# Patient Record
Sex: Female | Born: 2000 | Marital: Single | Smoking: Never smoker
Health system: Southern US, Community
[De-identification: ages and names within clinical notes are randomized; demographics above are authoritative.]

## PROBLEM LIST (undated history)

## (undated) DIAGNOSIS — R51 Headache: Secondary | ICD-10-CM

## (undated) DIAGNOSIS — F319 Bipolar disorder, unspecified: Secondary | ICD-10-CM

## (undated) DIAGNOSIS — F419 Anxiety disorder, unspecified: Secondary | ICD-10-CM

---
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
## 2021-07-04 ENCOUNTER — Emergency Department
Admission: EM | Admit: 2021-07-04 | Discharge: 2021-07-04 | Disposition: A | Discharge status: Home / Self Care | Payer: Medicaid Other | Attending: Emergency Medicine | Admitting: Emergency Medicine

## 2021-07-04 ENCOUNTER — Other Ambulatory Visit: Payer: Self-pay

## 2021-07-04 ENCOUNTER — Emergency Department: Payer: Medicaid Other

## 2021-07-04 DIAGNOSIS — R052 Subacute cough: Secondary | ICD-10-CM | POA: Diagnosis not present

## 2021-07-04 DIAGNOSIS — R0789 Other chest pain: Secondary | ICD-10-CM | POA: Diagnosis not present

## 2021-07-04 DIAGNOSIS — R059 Cough, unspecified: Secondary | ICD-10-CM | POA: Diagnosis present

## 2021-07-04 MED ORDER — BENZONATATE 100 MG PO CAPS: 100.0000 mg | ORAL_CAPSULE | Freq: Three times a day (TID) | ORAL | 0 refills | Status: AC | PRN

## 2021-07-04 NOTE — ED Triage Notes (Addendum)
Pt to ED via POV from home. Pt reports she has been coughing x3 weeks. Pt states the other day she heard cracking under her right breast. Pt also reports pain with inhalation.   Pt denies N/V/D, CP, fevers.

## 2021-07-04 NOTE — ED Notes (Signed)
Called x1

## 2021-07-04 NOTE — ED Provider Notes (Signed)
[facility] Emergency Department Provider Note   ____________________________________________   Event Date/Time   First MD Initiated Contact with Patient 07/04/21 1919     (approximate)  I have reviewed the triage vital signs and the nursing notes.   HISTORY  Chief Complaint Chest Pain and Shortness of Breath    HPI [name] [name] is a 21 y.o. female reports to the emergency room for complaint of cough for the past 3 weeks.  Patient reports that approximately 2 weeks ago she heard a "pop" underneath her right breast and then again hurting yesterday. She reports that her cough is dry and nonproductive.  She denies any fevers.  She denies any other signs of upper respiratory infection such as runny nose/sneezing/sore throat/ear pain. At this time she denies any shortness of breath/chest pain. She does report that intermittently she will have chest wall pain to the right breast and underarm region. She has not taken anything for the pain or the cough at this time.  Past Medical History:  Diagnosis Date   Anxiety    Bipolar disorder, unspecified (HCC) 02/19/2015   Headache(784.0)     Patient Active Problem List   Diagnosis Date Noted   Insomnia    Bipolar disorder, unspecified (HCC) 02/19/2015   MDD (major depressive disorder), single episode, severe (HCC) 12/30/2012   GAD (generalized anxiety disorder) 12/30/2012   Suicidal ideation 12/29/2012    No past surgical history on file.  Prior to Admission medications   Medication Sig Start Date End Date Taking? Authorizing Provider  benzonatate (TESSALON PERLES) 100 MG capsule Take 1 capsule (100 mg total) by mouth 3 (three) times daily as needed for up to 5 days for cough. 07/04/21 07/09/21 Yes Herschell Dimes, NP  ARIPiprazole (ABILIFY) 10 MG tablet Take 1 tablet (10 mg total) by mouth at bedtime. 02/25/15   Saez-Benito, Phillips Climes, MD  ibuprofen (ADVIL,MOTRIN) 600 MG tablet Take 1 tablet (600 mg  total) by mouth every 6 (six) hours as needed. 07/20/15   Beers, Charmayne Sheer, PA-C  ondansetron (ZOFRAN) 4 MG tablet Take 1 tablet (4 mg total) by mouth daily as needed for nausea or vomiting. 03/29/15   Emily Filbert, MD    Allergies Patient has no known allergies.  Family History  Problem Relation Age of Onset   Schizophrenia Maternal Aunt     Social History Social History   Tobacco Use   Smoking status: Never   Smokeless tobacco: Never  Substance Use Topics   Alcohol use: No   Drug use: No    Review of Systems  Constitutional: No fever/chills Eyes: No visual changes. ENT: No sore throat. Cardiovascular: Denies chest pain. Respiratory: Denies shortness of breath.  Positive for cough Gastrointestinal: No abdominal pain.  No nausea, no vomiting.  No diarrhea.  No constipation. Genitourinary: Negative for dysuria. Musculoskeletal: Positive for intermittent chest wall pain to the right breast and underarm region. Skin: Negative for rash. Neurological: Negative for headaches, focal weakness or numbness.   ____________________________________________   PHYSICAL EXAM:  VITAL SIGNS: ED Triage Vitals [07/04/21 1840]  Enc Vitals Group     BP 129/75     Pulse Rate 86     Resp 18     Temp 98.1 F (36.7 C)     Temp Source Oral     SpO2 98 %     Weight 140 lb (63.5 kg)     Height 5\' 4"  (1.626 m)  Head Circumference      Peak Flow      Pain Score 4     Pain Loc      Pain Edu?      Excl. in GC?     Constitutional: Alert and oriented. Well appearing and in no acute distress. Eyes: Conjunctivae are normal. PERRL. EOMI. Head: Atraumatic. Nose: No congestion/rhinnorhea. Mouth/Throat: Mucous membranes are moist.  Oropharynx non-erythematous. Neck: No stridor.   Cardiovascular: Normal rate, regular rhythm. Grossly normal heart sounds.  Good peripheral circulation. Respiratory: Normal respiratory effort.  No retractions. Lungs CTAB. Gastrointestinal: Soft and  nontender. No distention. No abdominal bruits. No CVA tenderness. Musculoskeletal: No lower extremity tenderness nor edema.  No joint effusions.  Patient has no reproducible pain to the right chest wall/breast region/underarm region.  And denies pain at this time. Neurologic:  Normal speech and language. No gross focal neurologic deficits are appreciated. No gait instability. Skin:  Skin is warm, dry and intact. No rash noted. Psychiatric: Mood and affect are normal. Speech and behavior are normal.  ____________________________________________   LABS (all labs ordered are listed, but only abnormal results are displayed)  Labs Reviewed - No data to display ____________________________________________  EKG  EKG was obtained and read by ED provider. EKG shows normal sinus rhythm at a rate of 72.  There is no ST elevation or depression.  PR and QRS intervals are normal. ____________________________________________  RADIOLOGY  ED MD interpretation: Chest x-ray was reviewed by me and read by radiologist.  Official radiology report(s): DG Chest 2 View  Result Date: 07/04/2021 CLINICAL DATA:  Shortness of breath EXAM: CHEST - 2 VIEW COMPARISON:  07/04/2007 FINDINGS: Cardiac and mediastinal contours are within normal limits. No focal pulmonary opacity. No pleural effusion or pneumothorax. No acute osseous abnormality. IMPRESSION: No acute cardiopulmonary process. Electronically Signed   By: Wiliam KeAlison  Vasan M.D.   On: 07/04/2021 19:23    ____________________________________________   PROCEDURES  Procedure(s) performed: None  Procedures  Critical Care performed: No  ____________________________________________   INITIAL IMPRESSION / ASSESSMENT AND PLAN / ED COURSE     [name] [name] is a 21 y.o. female reports to the emergency room for complaint of cough for the past 3 weeks.  Patient reports that approximately 2 weeks ago she heard a "pop" underneath her right breast and then  again hurting yesterday. She reports that her cough is dry and nonproductive.  She denies any fevers.  She denies any other signs of upper respiratory infection such as runny nose/sneezing/sore throat/ear pain. At this time she denies any shortness of breath/chest pain. She does report that intermittently she will have chest wall pain to the right breast and underarm region. She has not taken anything for the pain or the cough at this time. Patient reports that she is only coughing 2-3 times a day.  But feels that this is concerning as it is affecting her job.  Patient had a EKG that was normal. Patient also had chest x-ray that showed no acute pulmonary process.  Patient's exam was reassuring.  Based on review of EKG/chest x-ray and evaluation, patient will be discharged home in stable condition at this time.  I will prescribe her Tessalon Perles for the cough. She should follow-up with her primary care doctor if symptoms persist or worsen.      ____________________________________________   FINAL CLINICAL IMPRESSION(S) / ED DIAGNOSES  Final diagnoses:  Subacute cough  Chest wall pain     ED Discharge Orders  Ordered    benzonatate (TESSALON PERLES) 100 MG capsule  3 times daily PRN        07/04/21 1938             Note:  This document was prepared using Dragon voice recognition software and may include unintentional dictation errors.     Herschell Dimes, NP 07/04/21 2022    Shaune Pollack, MD 07/07/21 [phone]

## 2021-07-04 NOTE — Discharge Instructions (Addendum)
Seen today in the emergency room for a cough that has persisted for the past 3 weeks.  Your chest x-ray was normal and showed no signs of infection or fractured ribs which you were concerned about.  I will give you a short course of Tessalon Perles for the cough.  You should follow-up with your primary care provider if symptoms persist or worsen.

## 2023-12-01 ENCOUNTER — Encounter: Payer: Self-pay | Admitting: Emergency Medicine

## 2023-12-01 ENCOUNTER — Other Ambulatory Visit: Payer: Self-pay

## 2023-12-01 ENCOUNTER — Emergency Department
Admission: EM | Admit: 2023-12-01 | Discharge: 2023-12-01 | Discharge status: Against Medical Advice | Payer: Self-pay | Source: Ambulatory Visit | Attending: Emergency Medicine | Admitting: Emergency Medicine

## 2023-12-01 DIAGNOSIS — R1032 Left lower quadrant pain: Secondary | ICD-10-CM | POA: Insufficient documentation

## 2023-12-01 DIAGNOSIS — Z5321 Procedure and treatment not carried out due to patient leaving prior to being seen by health care provider: Secondary | ICD-10-CM | POA: Insufficient documentation

## 2023-12-01 LAB — COMPREHENSIVE METABOLIC PANEL WITH GFR
ALT: 19 U/L (ref 0–44)
AST: 18 U/L (ref 15–41)
Albumin: 4.1 g/dL (ref 3.5–5.0)
Alkaline Phosphatase: 50 U/L (ref 38–126)
Anion gap: 7 (ref 5–15)
BUN: 7 mg/dL (ref 6–20)
CO2: 23 mmol/L (ref 22–32)
Calcium: 9 mg/dL (ref 8.9–10.3)
Chloride: 107 mmol/L (ref 98–111)
Creatinine, Ser: 0.57 mg/dL (ref 0.44–1.00)
GFR, Estimated: >60 mL/min (ref >60)
Glucose, Bld: 96 mg/dL (ref 70–99)
Potassium: 4 mmol/L (ref 3.5–5.1)
Sodium: 137 mmol/L (ref 135–145)
Total Bilirubin: 0.6 mg/dL (ref 0.0–1.2)
Total Protein: 7 g/dL (ref 6.5–8.1)

## 2023-12-01 LAB — CBC
HCT: 42.3 % (ref 36.0–46.0)
Hemoglobin: 14.1 g/dL (ref 12.0–15.0)
MCH: 29.4 pg (ref 26.0–34.0)
MCHC: 33.3 g/dL (ref 30.0–36.0)
MCV: 88.1 fL (ref 80.0–100.0)
Platelets: 187 K/uL (ref 150–400)
RBC: 4.8 MIL/uL (ref 3.87–5.11)
RDW: 12 % (ref 11.5–15.5)
WBC: 6.8 K/uL (ref 4.0–10.5)
nRBC: 0 % (ref 0.0–0.2)

## 2023-12-01 LAB — LIPASE, BLOOD: Lipase: 26 U/L (ref 11–51)

## 2023-12-01 NOTE — ED Triage Notes (Signed)
 Sent to ED from [facility] for evaluation.  For C/O LLQ and suprapubic abd cramping.  Patient is requesting an ultrasound to be evaluated for endometriosis.'    AAOx3.  Skin warm and dry. NAD

## 2023-12-01 NOTE — ED Triage Notes (Signed)
 Patient to ED via POV form [facility] for intermittent abd cramping. Ongoing x1 year. States have some vomiting or diarrhea yesterday but none today. NAD noted. States irregular periods- last one in March.

## 2024-01-21 ENCOUNTER — Encounter: Payer: Self-pay | Admitting: Emergency Medicine

## 2024-01-21 ENCOUNTER — Other Ambulatory Visit: Payer: Self-pay

## 2024-01-21 ENCOUNTER — Emergency Department
Admission: EM | Admit: 2024-01-21 | Discharge: 2024-01-21 | Disposition: A | Discharge status: Home / Self Care | Payer: Self-pay | Attending: Emergency Medicine | Admitting: Emergency Medicine

## 2024-01-21 DIAGNOSIS — R040 Epistaxis: Secondary | ICD-10-CM | POA: Insufficient documentation

## 2024-01-21 DIAGNOSIS — J101 Influenza due to other identified influenza virus with other respiratory manifestations: Secondary | ICD-10-CM | POA: Insufficient documentation

## 2024-01-21 LAB — RESP PANEL BY RT-PCR (RSV, FLU A&B, COVID)  RVPGX2
Influenza A by PCR: POSITIVE — AB
Influenza B by PCR: NEGATIVE
Resp Syncytial Virus by PCR: NEGATIVE
SARS Coronavirus 2 by RT PCR: NEGATIVE

## 2024-01-21 MED ORDER — OXYMETAZOLINE HCL 0.05 % NA SOLN
1.0000 | Freq: Once | NASAL | Status: AC
  Administered 2024-01-21: 1 via NASAL
  Filled 2024-01-21: qty 30

## 2024-01-21 NOTE — ED Provider Notes (Addendum)
 "  [facility] Provider Note    Event Date/Time   First MD Initiated Contact with Patient 01/21/24 1539     (approximate)   History   Cough    HPI  [name] [name] is a 23 y.o. female    with a past medical history of MDD, facial contusion,  who presents to the ED complaining of cough, congestion  . According to the patient, symptoms started 24 hours ago, with fever of 103 that resolved today, patient endorses fianc and mother-in-law tested positive for influenza A.  Last menstrual period a month ago, patient is irregular.     Patient Active Problem List   Diagnosis Date Noted   Insomnia    Bipolar disorder, unspecified (HCC) 02/19/2015   MDD (major depressive disorder), single episode, severe (HCC) 12/30/2012   GAD (generalized anxiety disorder) 12/30/2012   Suicidal ideation 12/29/2012     Physical Exam   Triage Vital Signs: ED Triage Vitals [01/21/24 1423]  Encounter Vitals Group     BP 132/86     Girls Systolic BP Percentile      Girls Diastolic BP Percentile      Boys Systolic BP Percentile      Boys Diastolic BP Percentile      Pulse Rate 96     Resp 18     Temp 98.8 F (37.1 C)     Temp Source Oral     SpO2 97 %     Weight      Height      Head Circumference      Peak Flow      Pain Score 0     Pain Loc      Pain Education      Exclude from Growth Chart     Most recent vital signs: Vitals:   01/21/24 1423  BP: 132/86  Pulse: 96  Resp: 18  Temp: 98.8 F (37.1 C)  SpO2: 97%     Physical Exam Vitals and nursing note reviewed. During triage vital signs were normal  General:          Awake, no distress.  Nose: Presence of active epistaxis from the right nostril. Ears: Bilateral otoscopy within normal limits Throat: Mild peritonsillar erythema,  no exudates no petechia or tonsillar enlargement CV:                  Good peripheral perfusion. Regular rate and rhythm. Resp:               Normal effort. no  tachypnea.Equal breath sounds bilaterally.  No wheezing, no crackles, no rales Abd:                 No distention.  Soft nontender Other:               ED Results / Procedures / Treatments   Labs (all labs ordered are listed, but only abnormal results are displayed) Labs Reviewed  RESP PANEL BY RT-PCR (RSV, FLU A&B, COVID)  RVPGX2 - Abnormal; Notable for the following components:      Result Value   Influenza A by PCR POSITIVE (*)    All other components within normal limits     PROCEDURES:  Critical Care performed:   Procedures   MEDICATIONS ORDERED IN ED: Medications  oxymetazoline  (AFRIN) 0.05 % nasal spray 1 spray (has no administration in time range)   Clinical Course as of 01/21/24 1621  Fri Jan 21, 2024  1604 Influenza A positive [AE]    Clinical Course User Index [AE] Janit Kast, PA-C    IMPRESSION / MDM / ASSESSMENT AND PLAN / ED COURSE  I reviewed the triage vital signs and the nursing notes.  Differential diagnosis includes, but is not limited to, influenza A, COVID, RSV, anterior epistaxis, posterior epistaxis, unlikely foreign body, pneumonia.  Patient's presentation is most consistent with acute complicated illness / injury requiring diagnostic workup.   [name] [name] is a 23 y.o., female who presents today with history of 24 hours of fever that resolved yesterday, cough, epistaxis.  See HPI for the information.  On a physical exam vital signs were normal during triage, patient was afebrile.  No use of accessory muscles.  Presence of epistaxis from the right nostril.  Throat presence of mild peritonsillar erythema.  Ears, bilateral otoscopy within normal limits.  Cardiopulmonary is clear, no wheezing, no rales, no crackles.  Rest of physical exam is normal. Plan Afrin Reassess Patient's diagnosis is consistent with influenza A, epistaxis. I did not order any imaging, physical exam reassuring labs are  reassuring. I did review the patient's  allergies and medications.The patient is in stable and satisfactory condition for discharge home  Patient will be discharged home without prescriptions.  Afrin container was given to the patient.  I did advise patient to take plenty of fluids, to prevent dehydration.  To take Tylenol  every 6 hours as needed for fever or pain.  Patient is to follow up with ENT and PCP as needed or otherwise directed. Patient is given ED precautions to return to the ED for any worsening or new symptoms.  Work note will be provided. Discussed plan of care with patient, answered all of patient's questions, patient agreeable to plan of care. Advised patient to take medications according to the instructions on the label. Discussed possible side effects of new medications. Patient verbalized understanding.  FINAL CLINICAL IMPRESSION(S) / ED DIAGNOSES   Final diagnoses:  Influenza A  Epistaxis     Rx / DC Orders   ED Discharge Orders     None        Note:  This document was prepared using Dragon voice recognition software and may include unintentional dictation errors.   Janit Kast, PA-C 01/22/24 0017    Janit Kast, PA-C 01/22/24 0017    Dorothyann Drivers, MD 01/22/24 2242  "

## 2024-01-21 NOTE — Discharge Instructions (Signed)
 You have been diagnosed with epistaxis, influenza A.  Please drink plenty of fluids.  Please take Tylenol  every 6 hours as needed for pain or fever.  Please call Dr. Herminio and make an appointment for a follow-up of your nosebleeding.  You can take DayQuil every 12 hours for nasal congestion.  Please come back to ED or go to your PCP if you have new symptoms symptoms worsen.

## 2024-01-21 NOTE — ED Triage Notes (Signed)
 Pt reports she was exposed to Flu A this week. Reports cough, congestion, and body aches.
